# Patient Record
Sex: Female | Born: 1955 | Race: White | Hispanic: No
Health system: Southern US, Community
[De-identification: ages and names within clinical notes are randomized; demographics above are authoritative.]

---

## 2020-11-20 ENCOUNTER — Other Ambulatory Visit: Payer: Self-pay

## 2020-11-20 ENCOUNTER — Emergency Department: Payer: Managed Care, Other (non HMO)

## 2020-11-20 ENCOUNTER — Emergency Department
Admission: EM | Admit: 2020-11-20 | Discharge: 2020-11-20 | Disposition: A | Discharge status: Home / Self Care | Payer: Managed Care, Other (non HMO) | Attending: Emergency Medicine | Admitting: Emergency Medicine

## 2020-11-20 DIAGNOSIS — S4992XA Unspecified injury of left shoulder and upper arm, initial encounter: Secondary | ICD-10-CM | POA: Diagnosis present

## 2020-11-20 DIAGNOSIS — M79602 Pain in left arm: Secondary | ICD-10-CM

## 2020-11-20 DIAGNOSIS — W010XXA Fall on same level from slipping, tripping and stumbling without subsequent striking against object, initial encounter: Secondary | ICD-10-CM | POA: Insufficient documentation

## 2020-11-20 DIAGNOSIS — Y92002 Bathroom of unspecified non-institutional (private) residence single-family (private) house as the place of occurrence of the external cause: Secondary | ICD-10-CM | POA: Diagnosis not present

## 2020-11-20 DIAGNOSIS — W19XXXA Unspecified fall, initial encounter: Secondary | ICD-10-CM

## 2020-11-20 DIAGNOSIS — S42202A Unspecified fracture of upper end of left humerus, initial encounter for closed fracture: Secondary | ICD-10-CM | POA: Insufficient documentation

## 2020-11-20 MED ORDER — ACETAMINOPHEN 500 MG PO TABS
1000.0000 mg | ORAL_TABLET | Freq: Once | ORAL | Status: AC
  Administered 2020-11-20: 1000 mg via ORAL
  Filled 2020-11-20: qty 2

## 2020-11-20 MED ORDER — MORPHINE SULFATE (PF) 4 MG/ML IV SOLN
4.0000 mg | Freq: Once | INTRAVENOUS | Status: AC
  Administered 2020-11-20: 4 mg via INTRAVENOUS
  Filled 2020-11-20: qty 1

## 2020-11-20 MED ORDER — OXYCODONE HCL 5 MG PO TABS
5.0000 mg | ORAL_TABLET | Freq: Once | ORAL | Status: AC
  Administered 2020-11-20: 5 mg via ORAL
  Filled 2020-11-20: qty 1

## 2020-11-20 MED ORDER — OXYCODONE HCL 5 MG PO TABS: 5.0000 mg | ORAL_TABLET | Freq: Four times a day (QID) | ORAL | 0 refills | Status: AC | PRN

## 2020-11-20 NOTE — ED Notes (Signed)
Sling applied, patient alert and oriented upon discharge. Provided discharge instructions and verbalized understanding.

## 2020-11-20 NOTE — ED Triage Notes (Signed)
Reported via EMS for mechanical fall. Tried to catch herself with left arm. Pain and swelling to left shoulder. Fentanyl 50 mcg given in route.

## 2020-11-20 NOTE — ED Provider Notes (Signed)
[facility] Emergency Department Provider Note ____________________________________________   Event Date/Time   First MD Initiated Contact with Patient 11/20/20 1432     (approximate)  I have reviewed the triage vital signs and the nursing notes.  HISTORY  Chief Complaint Arm Injury   HPI Brandy Cox is a 65 y.o. femalewho presents to the ED for evaluation of left arm/shoulder injury.  Chart review indicates no relevant medical history.  No thinners.  Patient is obese.  Patient reports being out to lunch this afternoon, when she walked to the restroom, and tripped on a lip leading to the tiled bathroom.  She reports a mechanical fall forward where she caught herself with her left arm, then had immediate left shoulder/proximal arm pain.  This occurred about an hour prior to my arrival.  Patient reports that she did not syncopized or hit her head during this fall.  She reports that she was able to get up with assistance, and has ambulated since this fall.  She reports chronic right hip pain at his baseline.  Reports she initially had 10/10 intensity left shoulder pain that is aching and constant, improved by EMS fentanyl.  Denies additional pain acutely.   History reviewed. No pertinent past medical history.  There are no problems to display for this patient.    Prior to Admission medications   Not on File    Allergies Lovenox [enoxaparin]  History reviewed. No pertinent family history.  Social History    Review of Systems  Constitutional: No fever/chills Eyes: No visual changes. ENT: No sore throat. Cardiovascular: Denies chest pain. Respiratory: Denies shortness of breath. Gastrointestinal: No abdominal pain.  No nausea, no vomiting.  No diarrhea.  No constipation. Genitourinary: Negative for dysuria. Musculoskeletal: Negative for back pain.  Positive for chronic right hip pain. Positive for acute left shoulder and proximal arm  pain. Skin: Negative for rash. Neurological: Negative for headaches, focal weakness or numbness.   ____________________________________________   PHYSICAL EXAM:  VITAL SIGNS: Vitals:   11/20/20 1435  BP: (!) 141/78  Pulse: 92  Resp: 18  Temp: 97.9 F (36.6 C)  SpO2: 100%     Constitutional: Alert and oriented. Well appearing and in no acute distress.  Supine and obese.  Pleasant and conversational in full sentences. Eyes: Conjunctivae are normal. PERRL. EOMI. Head: Atraumatic. Nose: No congestion/rhinnorhea. Mouth/Throat: Mucous membranes are moist.  Oropharynx non-erythematous. Neck: No stridor. No cervical spine tenderness to palpation. Cardiovascular: Normal rate, regular rhythm. Grossly normal heart sounds.  Good peripheral circulation. Respiratory: Normal respiratory effort.  No retractions. Lungs CTAB. Gastrointestinal: Soft , nondistended, nontender to palpation. No CVA tenderness. Musculoskeletal: No lower extremity tenderness nor edema.  No joint effusions. Tenderness to palpation throughout the proximal left humerus, left shoulder and distal left clavicle.  No overlying lacerations or skin changes to suggest an open injury.  Left arm is distally neurovascularly intact. Full palpation of all 4 extremities otherwise without evidence of deformity or acute traumatic pathology. She reports some chronic tenderness over her right hip reportedly at her baseline. Full active and passive ROM to bilateral lower extremities. Neurologic:  Normal speech and language. No gross focal neurologic deficits are appreciated.  Skin:  Skin is warm, dry and intact. No rash noted. Psychiatric: Mood and affect are normal. Speech and behavior are normal. ____________________________________________  RADIOLOGY  ED MD interpretation:    Official radiology report(s): No results found.  ____________________________________________   PROCEDURES and INTERVENTIONS  Procedure(s) performed  (including Critical  Care):  Procedures  Medications  acetaminophen (TYLENOL) tablet 1,000 mg (has no administration in time range)  morphine 4 MG/ML injection 4 mg (4 mg Intravenous Given 11/20/20 1451)    ____________________________________________   MDM / ED COURSE   Obese 65 year old woman not on thinners brought to the ED after mechanical fall with left arm pain. Hemodynamically stable. Exam with isolated pain and tenderness to her left proximal humerus/shoulder. No evidence of further acute pathology. No evidence of open injury. Left arm is neurovascularly intact. I suspect proximal humerus fracture on the left. Patient signed out to oncoming provider to follow-up on these images and dispo accordingly.      ____________________________________________   FINAL CLINICAL IMPRESSION(S) / ED DIAGNOSES  Final diagnoses:  Fall, initial encounter  Left arm pain     ED Discharge Orders    None       Abigayl Hor   Note:  This document was prepared using Dragon voice recognition software and may include unintentional dictation errors.   Delton Prairie, MD 11/20/20 [phone]

## 2020-11-20 NOTE — ED Provider Notes (Signed)
4:07 PM Assumed care for off going team.   Blood pressure (!) 141/78, pulse 92, temperature 97.9 F (36.6 C), temperature source Oral, resp. rate 18, height 5\' 3"  (1.6 m), weight 131.5 kg, SpO2 100 %.  See their HPI for full report but in brief patient with mechanical fall onto left arm.  Follow-up on imaging  IMPRESSION: Moderately angulated and comminuted proximal left humeral fracture.  Patient has pain on the proximal left humerus.  She states at baseline she is unable to lift the arm above her her shoulder secondary to chronic shoulder issues.  Denies any new pain in her left shoulder.  No pain in the lower arm or wrist.  She is right-handed.  The x-ray does show the above fracture.  Patient is neurovascularly intact in this hand.  I did discuss with Dr. from orthopedics who recommended sling and following up in the clinic earlier this week for splinting.     Patient denies any other injuries to suggest any additional CTs or x-rays.  Reevaluated patient and updated on the above.  We discussed using Tylenol for pain.  Patient already on NSAID.  Will give oxycodone for breakthrough pain.  She understands not to drive or work while on this.  Patient feels comfortable with being discharged home and follow-up in orthopedic clinic        Allena Katz, MD 11/20/20 [phone]

## 2020-11-20 NOTE — Discharge Instructions (Addendum)
You need to call the orthopedic department because you need a follow-up appointment to have a certain type of splint placed on your left arm.  Take Tylenol 1 g every 8 hours and use the oxycodone for breakthrough pain.  Do not drive or operate machinery while on this   IMPRESSION: Moderately angulated and comminuted proximal left humeral fracture.

## 2022-04-03 IMAGING — CR DG CHEST 1V PORT
1 series · 1 of 1 positions shown · non-contrast
Comparison: None.

CLINICAL DATA: Left arm pain after fall.

EXAM:
PORTABLE CHEST 1 VIEW

[dg chest port 1 view]
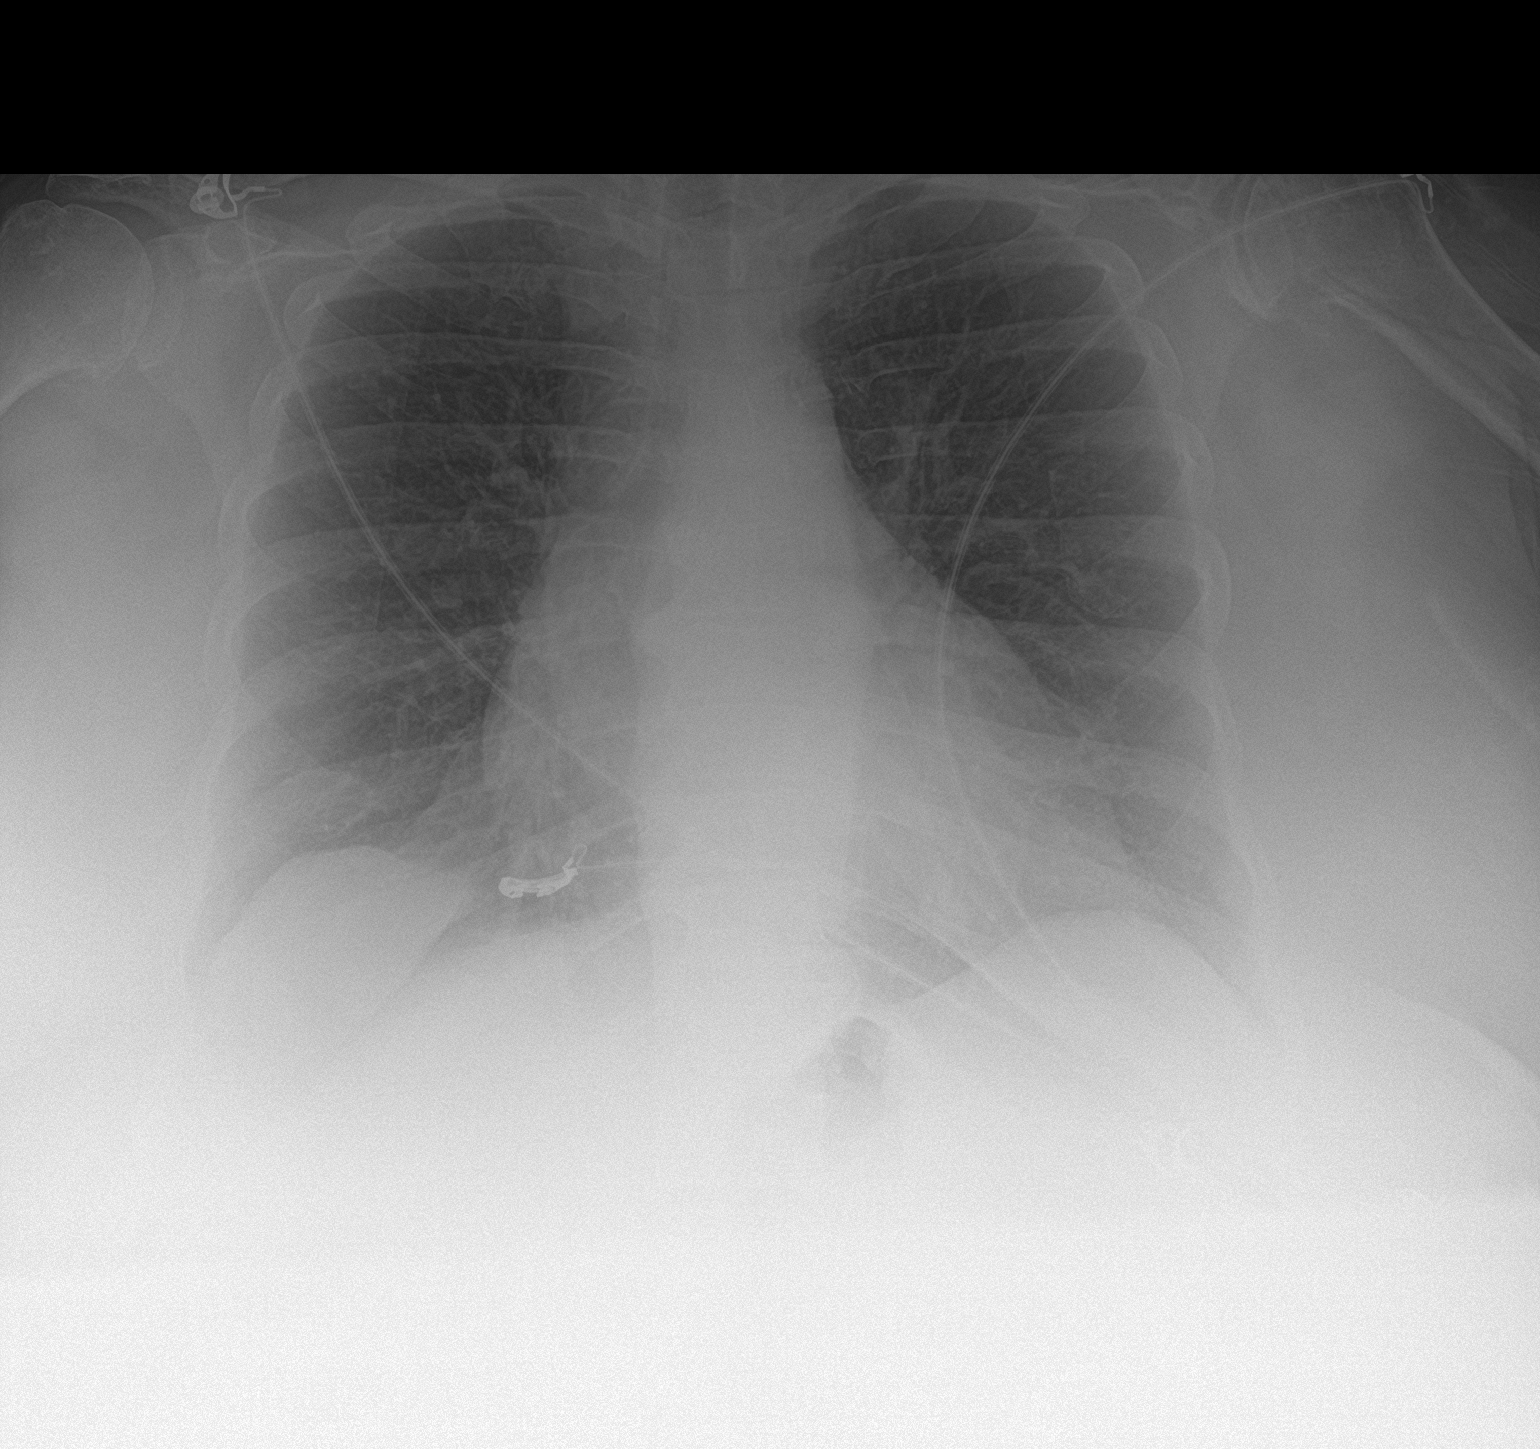

[1 of 1 positions shown; findings below may reference images not displayed]

FINDINGS: The heart size and mediastinal contours are within normal limits.
Both lungs are clear. No pneumothorax or pleural effusion is noted.
The visualized skeletal structures are unremarkable.
IMPRESSION: No active disease.

## 2022-04-03 IMAGING — CR DG HUMERUS 2V *L*
1 series · 3 of 3 positions shown · non-contrast
Comparison: None.

CLINICAL DATA: Left arm pain after fall.

EXAM:
LEFT HUMERUS - 2+ VIEW

[Series 1: dg humerus left · 0.14mm/px · 3 of 3 slices shown]
[im 1/3]
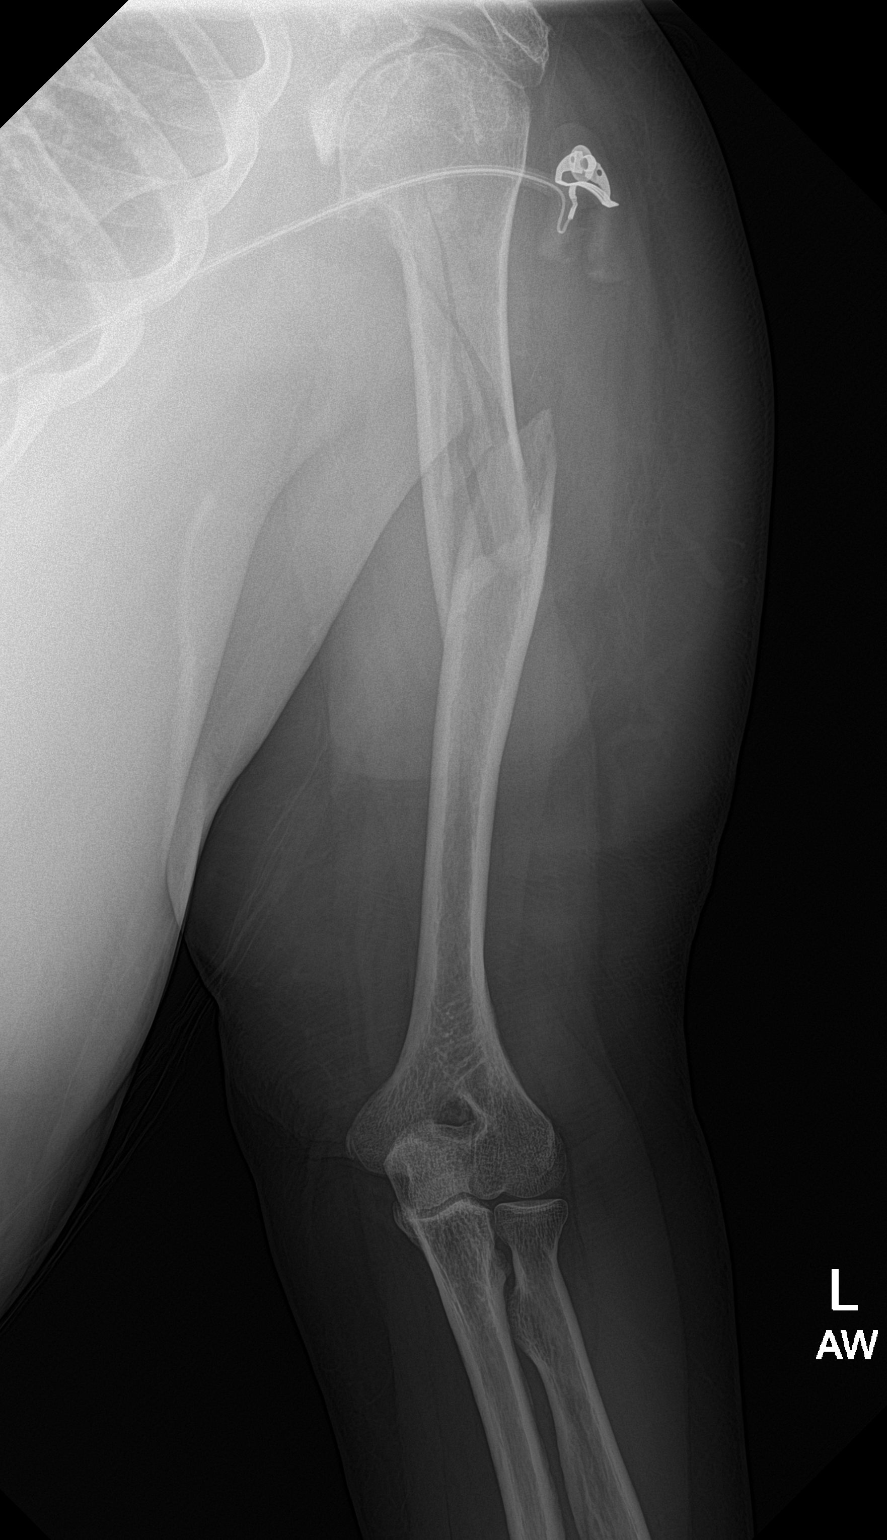
[im 2/3]
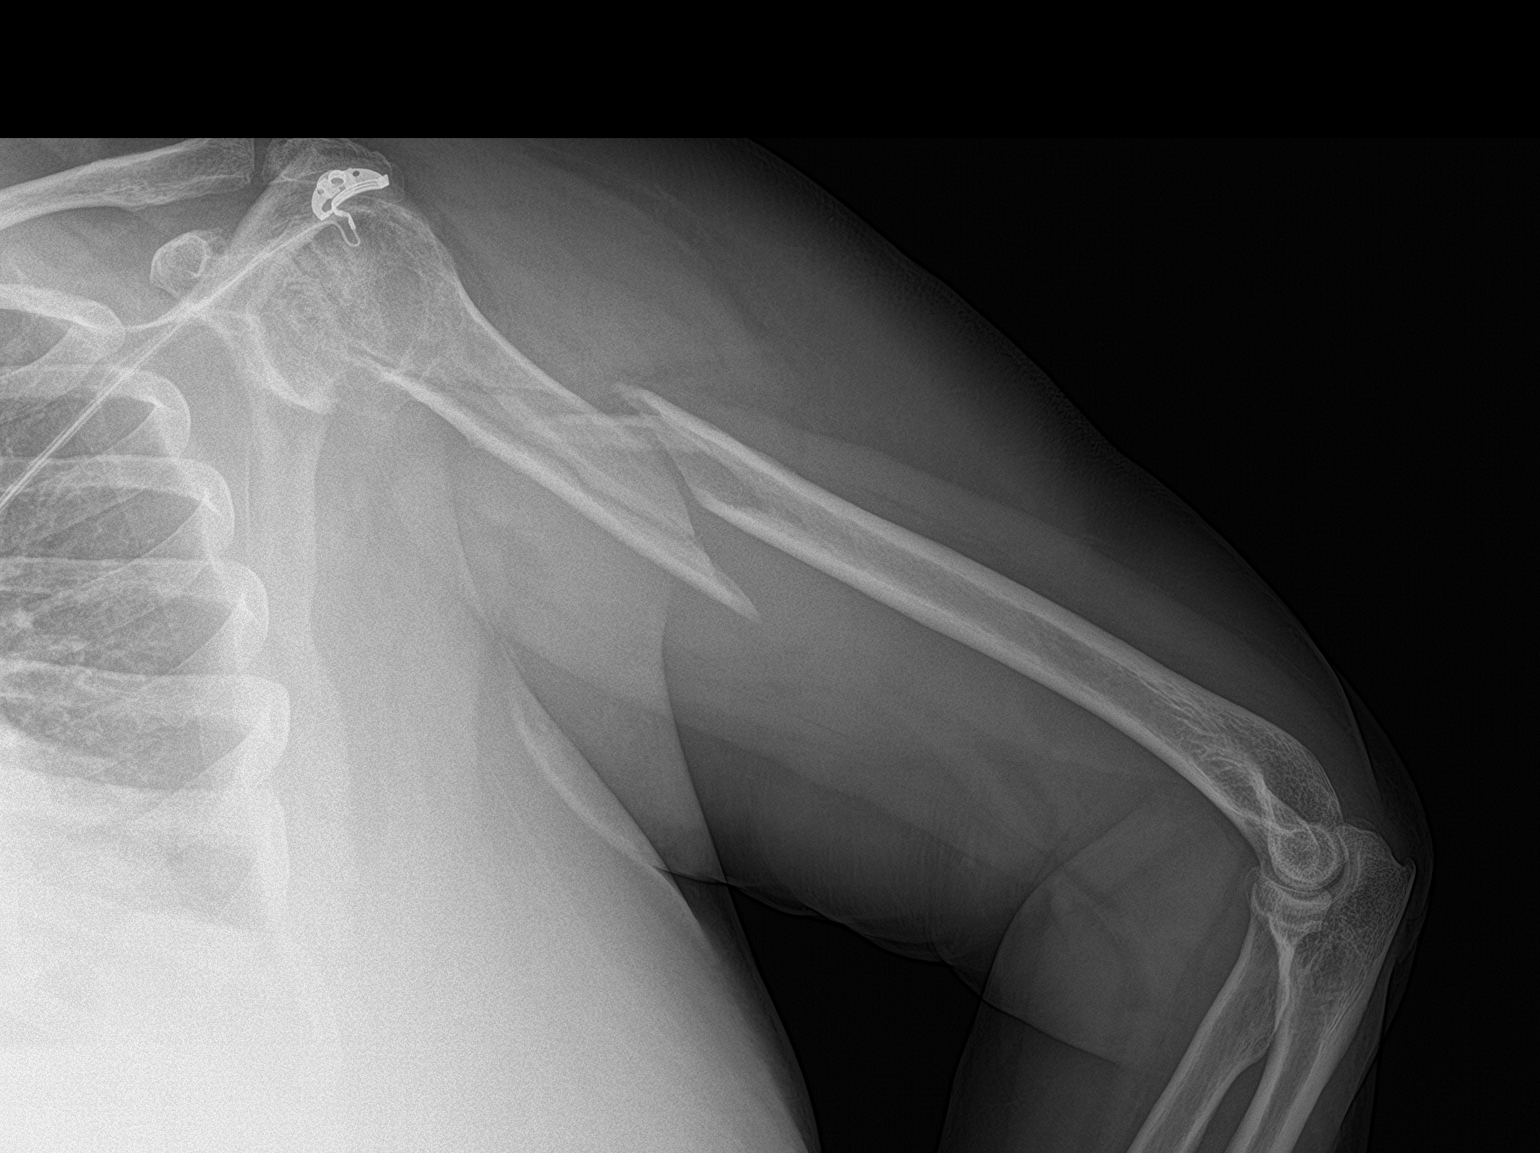
[im 3/3]
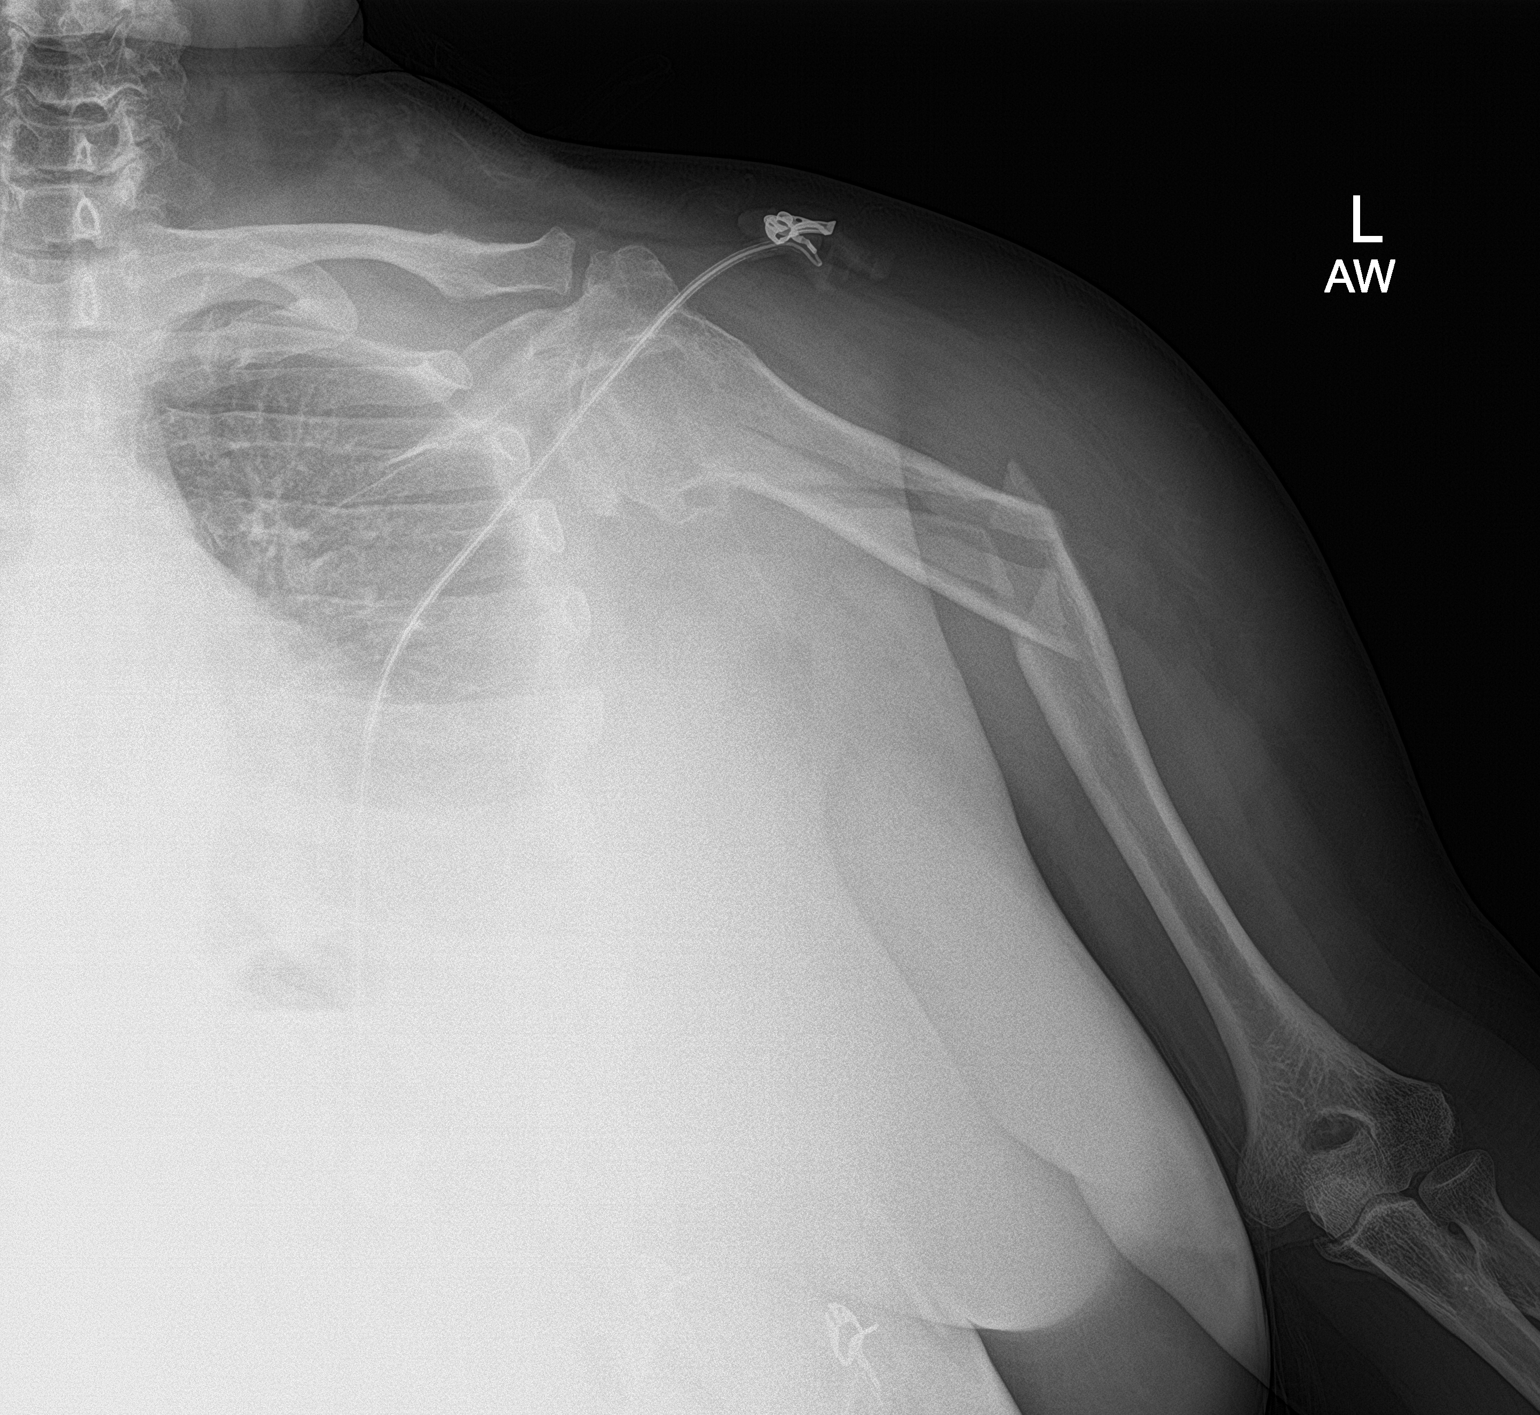

[3 of 3 positions shown; findings below may reference images not displayed]

FINDINGS: Moderately angulated and comminuted fracture is seen involving the
proximal left humerus.
IMPRESSION: Moderately angulated and comminuted proximal left humeral fracture.
